# Patient Record
Sex: Female | Born: 1980 | Race: White | Hispanic: No | Marital: Single | Smoking: Current every day smoker
Health system: Southern US, Community
[De-identification: ages and names within clinical notes are randomized; demographics above are authoritative.]

## PROBLEM LIST (undated history)

## (undated) DIAGNOSIS — R87629 Unspecified abnormal cytological findings in specimens from vagina: Secondary | ICD-10-CM

## (undated) DIAGNOSIS — N870 Mild cervical dysplasia: Secondary | ICD-10-CM

## (undated) HISTORY — DX: Unspecified abnormal cytological findings in specimens from vagina: R87.629

## (undated) HISTORY — PX: PLACEMENT OF BREAST IMPLANTS: SHX6334

## (undated) HISTORY — DX: Mild cervical dysplasia: N87.0

---

## 2012-10-19 ENCOUNTER — Ambulatory Visit: Payer: Self-pay | Admitting: General Surgery

## 2012-11-05 ENCOUNTER — Encounter: Payer: Self-pay | Admitting: *Deleted

## 2016-07-01 ENCOUNTER — Encounter: Payer: Self-pay | Admitting: *Deleted

## 2016-07-01 ENCOUNTER — Ambulatory Visit: Payer: Self-pay | Attending: Oncology | Admitting: *Deleted

## 2016-07-01 VITALS — BP 133/87 | HR 76 | Temp 98.3°F | Ht 66.54 in | Wt 140.9 lb

## 2016-07-01 DIAGNOSIS — N63 Unspecified lump in unspecified breast: Secondary | ICD-10-CM

## 2016-07-01 NOTE — Patient Instructions (Signed)
HPV Test The human papillomavirus (HPV) test is used to look for high-risk types of HPV infection. HPV is a group of about 100 viruses. Many of these viruses cause growths on, in, or around the genitals. Most HPV viruses cause infections that usually go away without treatment. However, HPV types 6, 11, 16, and 18 are considered high-risk types of HPV that can increase your risk of cancer of the cervix or anus if the infection is left untreated. An HPV test identifies the DNA (genetic) strands of the HPV infection, so it is also referred to as the HPV DNA test. Although HPV is found in both males and females, the HPV test is only used to screen for increased cancer risk in females:  With an abnormal Pap test.  After treatment of an abnormal Pap test.  Between the ages of 30 and 65.  After treatment of a high-risk HPV infection. The HPV test may be done at the same time as a pelvic exam and Pap test in females over the age of 30. Both the HPV test and Pap test require a sample of cells from the cervix. How do I prepare for this test?  Do not douche or take a bath for 24-48 hours before the test or as directed by your health care provider.  Do not have sex for 24-48 hours before the test or as directed by your health care provider.  You may be asked to reschedule the test if you are menstruating.  You will be asked to urinate before the test. What do the results mean? It is your responsibility to obtain your test results. Ask the lab or department performing the test when and how you will get your results. Talk with your health care provider if you have any questions about your results. Your result will be negative or positive. Meaning of Negative Test Results  A negative HPV test result means that no HPV was found, and it is very likely that you do not have HPV. Meaning of Positive Test Results  A positive HPV test result indicates that you have HPV.  If your test result shows the presence  of any high-risk HPV strains, you may have an increased risk of developing cancer of the cervix or anus if the infection is left untreated.  If any low-risk HPV strains are found, you are not likely to have an increased risk of cancer. Discuss your test results with your health care provider. He or she will use the results to make a diagnosis and determine a treatment plan that is right for you. Talk with your health care provider to discuss your results, treatment options, and if necessary, the need for more tests. Talk with your health care provider if you have any questions about your results. This information is not intended to replace advice given to you by your health care provider. Make sure you discuss any questions you have with your health care provider. Document Released: 05/17/2004 Document Revised: 12/27/2015 Document Reviewed: 09/07/2013 Elsevier Interactive Patient Education  2017 Elsevier Inc.  Gave patient hand-out, Women Staying Healthy, Active and Well from [facility], with education on breast health, pap smears, heart and colon health.  

## 2016-07-01 NOTE — Progress Notes (Signed)
Subjective:     Patient ID: Sheila Sellers, female   DOB: Mar 13, 1981, 36 y.o.   MRN: [id number]  HPI   Review of Systems     Objective:   Physical Exam  Pulmonary/Chest: Right breast exhibits mass. Right breast exhibits no inverted nipple, no nipple discharge, no skin change and no tenderness. Left breast exhibits mass. Left breast exhibits no inverted nipple, no nipple discharge, no skin change and no tenderness. Breasts are symmetrical.         Assessment:     36 year old White female referred to [facility] by Levin ErpAlicia Copeland, NP at [facility] for financial assistance for further evaluation of her abnormal pap smear.  Patient with a history of abnormal paps.  Had:  ASCUS 2014, 10/14/13 Normal, 01/21/15 had ASCUS, and 05/08/16 had normal pap with positive HPV of 16 and 18.  Patient states she has had a left breast mass for over 10 years, but the area of concern has gotten much more tender over the last 4 months since taking a new birth control with progesterone.  Patient does have a family history of breast cancer in a paternal aunt diagnosed in her 830's or 4440's per Copeland's notes.  On clinical breast exam bilateral breast have scattered firm fibroglandular like tissue.  There is an approximate 1 cm nodule at 6:00 right breast, and an approximate 3X2 cm tender mobile nodule at 2:00 left breast. 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  She has a good understanding of the plan of care.  Patient has been screened for eligibility.  She does not have any insurance, Medicare or Medicaid.  She also meets financial eligibility.  Hand-out given on the Affordable Care Act.    Plan:     Bilateral diagnostic mammogram with ultrasound ordered.  Discussed if no findings on imaging the possibility of referral for a surgical consult.  Joellyn Quailshristy Burton to schedule patient her mammogram and appointment back to [facility] for colposcopy and possible biopsy.  Will follow-up per [facility]  protocol.

## 2016-07-11 ENCOUNTER — Ambulatory Visit
Admission: RE | Admit: 2016-07-11 | Discharge: 2016-07-11 | Disposition: A | Discharge status: Home / Self Care | Payer: Self-pay | Source: Ambulatory Visit | Attending: Oncology | Admitting: Oncology

## 2016-07-11 ENCOUNTER — Encounter: Payer: Self-pay | Admitting: Obstetrics and Gynecology

## 2016-07-11 DIAGNOSIS — N63 Unspecified lump in unspecified breast: Secondary | ICD-10-CM

## 2016-07-12 ENCOUNTER — Telehealth: Payer: Self-pay | Admitting: *Deleted

## 2016-07-12 NOTE — Telephone Encounter (Signed)
Called and talked with patient today to discuss her mammogram and ultrasound results.  In the right breast there are simple cysts noted at the site of concern, and dense breast tissue in the left breast at the area of targeted pain and palpable mass.  Offered to send patient for a surgical consult.  States the area in the left breast has been there for over 10 years and right now, the pain is better and tissue is much softer, which she attributes to her menstrual cycle.  Encouraged a second opinion but she is comfortable with her results.  She is to call if she changes her mind or notices a difference in the areas of concern.  She is agreeable.  Radiology recommended annual mammogram at age 89.  HSIS to [facility].

## 2016-07-23 ENCOUNTER — Encounter: Payer: Self-pay | Admitting: Obstetrics & Gynecology

## 2016-07-23 ENCOUNTER — Ambulatory Visit (INDEPENDENT_AMBULATORY_CARE_PROVIDER_SITE_OTHER): Payer: Self-pay | Admitting: Obstetrics & Gynecology

## 2016-07-23 VITALS — BP 100/60 | HR 86 | Ht 66.0 in | Wt 139.0 lb

## 2016-07-23 DIAGNOSIS — B977 Papillomavirus as the cause of diseases classified elsewhere: Secondary | ICD-10-CM

## 2016-07-23 DIAGNOSIS — N72 Inflammatory disease of cervix uteri: Principal | ICD-10-CM

## 2016-07-23 NOTE — Progress Notes (Signed)
  Referring Provider:  [facility] PAP by Copland at [facility]  HPI:  Sheila Sellers is a 36 y.o.  No obstetric history on file.  who presents today for evaluation and management of abnormal cervical cytology.    Dysplasia History: History of intermittant LGSIL, ASCUS, HPV, and Normal PAPS over the last few years. Prior cervical dysplasia and treatment 20 years ago (possible LEEP, [place] unclear, [place]  ROS:  Pertinent items noted in HPI and remainder of comprehensive ROS otherwise negative.  OB History  No data available    No past medical history on file.  No past surgical history on file.  SOCIAL HISTORY:  History  Alcohol Use  . Yes    History  Drug Use No     Family History  Problem Relation Age of Onset  . Breast cancer Maternal Aunt     ALLERGIES:  Patient has no known allergies.  No current outpatient prescriptions on file prior to visit.   No current facility-administered medications on file prior to visit.     Physical Exam: -Vitals:  BP 100/60   Pulse 86   Ht 5\' 6"  (1.676 m)   Wt 139 lb (63 kg)   LMP 07/07/2016   BMI 22.44 kg/m  GEN: WD, WN, NAD.  A+ O x 3, good mood and affect. ABD:  NT, ND.  Soft, no masses.  No hernias noted.   Pelvic:   Vulva: Normal appearance.  No lesions.  Vagina: No lesions or abnormalities noted.  Support: Normal pelvic support.  Urethra No masses tenderness or scarring.  Meatus Normal size without lesions or prolapse.  Cervix: See below.  Anus: Normal exam.  No lesions.  Perineum: Normal exam.  No lesions.        Bimanual   Uterus: Normal size.  Non-tender.  Mobile.  AV.  Adnexae: No masses.  Non-tender to palpation.  Cul-de-sac: Negative for abnormality.   PROCEDURE: 1.  Urine Pregnancy Test:  negative 2.  Colposcopy performed with 4% acetic acid after verbal consent obtained                                         -Aceto-white Lesions Location(s): none.              -Biopsy performed at 11 o'clock           -ECC indicated and performed: Yes.       -Biopsy sites made hemostatic with pressure, AgNO3, and/or Monsel's solution   -Satisfactory colposcopy: Yes.      -Evidence of Invasive cervical CA :  NO  ASSESSMENT:  Sheila Sellers is a 36 y.o. No obstetric history on file. here for  1. High risk human papilloma virus (HPV) infection of cervix   .  PLAN: 1.  I discussed the grading system of pap smears and HPV high risk viral types.  We will discuss and base management after colpo results return. 2. Follow up PAP 6 months, vs intervention if high grade dysplasia identified 3. Treatment of persistantly abnormal PAP smears and cervical dysplasia, even mild, is discussed w pt today in detail, as well as the pros and cons of Cryo and LEEP procedures. Will consider and discuss after results.      Annamarie MajorPaul Arliene Rosenow, MD, Merlinda FrederickFACOG [facility], [facility] 07/23/2016  1:29 PM

## 2016-07-26 LAB — PATHOLOGY

## 2016-07-31 ENCOUNTER — Encounter: Payer: Self-pay | Admitting: Obstetrics and Gynecology

## 2016-07-31 ENCOUNTER — Other Ambulatory Visit: Payer: Self-pay | Admitting: Obstetrics and Gynecology

## 2016-07-31 MED ORDER — NORETHINDRONE 0.35 MG PO TABS: 1.0000 | ORAL_TABLET | Freq: Every day | ORAL | 10 refills | Status: DC

## 2016-09-12 ENCOUNTER — Telehealth: Payer: Self-pay | Admitting: Obstetrics & Gynecology

## 2016-09-12 NOTE — Telephone Encounter (Signed)
Pt is calling about bill stating that she was suppose to be covered under the Cancer center for the Colpo. Would you please look into this . [phone]

## 2016-09-13 NOTE — Telephone Encounter (Signed)
Pt is calling about her bill with Questions. Please advise.

## 2016-12-25 ENCOUNTER — Ambulatory Visit: Payer: Self-pay | Attending: Oncology

## 2018-04-10 ENCOUNTER — Encounter: Payer: Self-pay | Admitting: Family Medicine

## 2018-04-10 ENCOUNTER — Other Ambulatory Visit: Payer: Self-pay | Admitting: Family Medicine

## 2018-04-10 ENCOUNTER — Ambulatory Visit: Payer: Self-pay | Admitting: Family Medicine

## 2018-04-10 VITALS — BP 117/75 | HR 86 | Temp 98.6°F | Resp 16 | Ht 66.0 in | Wt 141.0 lb

## 2018-04-10 DIAGNOSIS — N870 Mild cervical dysplasia: Secondary | ICD-10-CM

## 2018-04-10 DIAGNOSIS — Z720 Tobacco use: Secondary | ICD-10-CM | POA: Diagnosis not present

## 2018-04-10 DIAGNOSIS — Z7689 Persons encountering health services in other specified circumstances: Secondary | ICD-10-CM

## 2018-04-10 DIAGNOSIS — Z13220 Encounter for screening for lipoid disorders: Secondary | ICD-10-CM

## 2018-04-10 DIAGNOSIS — N72 Inflammatory disease of cervix uteri: Secondary | ICD-10-CM | POA: Diagnosis not present

## 2018-04-10 DIAGNOSIS — Z131 Encounter for screening for diabetes mellitus: Secondary | ICD-10-CM

## 2018-04-10 DIAGNOSIS — B977 Papillomavirus as the cause of diseases classified elsewhere: Secondary | ICD-10-CM

## 2018-04-10 DIAGNOSIS — Z114 Encounter for screening for human immunodeficiency virus [HIV]: Secondary | ICD-10-CM

## 2018-04-10 DIAGNOSIS — Z0000 Encounter for general adult medical examination without abnormal findings: Secondary | ICD-10-CM

## 2018-04-10 NOTE — Progress Notes (Signed)
Subjective:    Patient ID: Sheila Sellers, female    DOB: 01-12-1981, 37 y.o.   MRN: [id number]  Sheila Drapemanda Gettinger is a 37 y.o. female presenting on 04/10/2018 for Establish Care and Abnormal Pap Smear  Previously without PCP, she has moved to [place] recently, here to establish.  HPI   General Health / Lifestyle / Tobacco Use - She has no new medical complaints or concerns. 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 Most recently followed by [facility] locally in 07/2016 they performed Colposcopy after abnormal pap and showed Low Grade CIN dysplasia.   Health Maintenance: UTD FLu vaccine from 03/2018 at work Due for routine HIV screen, has not had this lab tested before by her knowledge, agrees to check with next lab. UTD Tdap by report approx 2016  Depression screen PHQ 2/9 04/10/2018  Decreased Interest 0  Down, Depressed, Hopeless 0  PHQ - 2 Score 0    Past Medical History:  Diagnosis Date  . Abnormal vaginal Pap smear    History reviewed. No pertinent surgical history. Social History   Socioeconomic History  . Marital status: Single    Spouse name: Not on file  . Number of children: Not on file  . Years of education: Graduate/Professional  . Highest education level: Professional school degree (e.g., MD, DDS, DVM, JD)  Occupational History  . Occupation: Neurosurgery PA    Comment: [employer]  Social Needs  . Financial resource strain: Not on file  . Food insecurity:    Worry: Not on file    Inability: Not on file  . Transportation  needs:    Medical: Not on file    Non-medical: Not on file  Tobacco Use  . Smoking status: Current Every Day Smoker    Packs/day: 0.50    Years: 20.00    Pack years: 10.00    Types: Cigarettes  . Smokeless tobacco: Never Used  Substance and Sexual Activity  . Alcohol use: Yes    Alcohol/week: 4.0 standard drinks    Types: 4 Standard drinks or equivalent per week  . Drug use: No  . Sexual activity: Yes    Birth control/protection: None  Lifestyle  . Physical activity:    Days per week: Not on file    Minutes per session: Not on file  . Stress: Not on file  Relationships  . Social connections:    Talks on phone: Not on file    Gets together: Not on file    Attends religious service: Not on file    Active member of club or organization: Not on file    Attends meetings of clubs or organizations: Not on file    Relationship status: Not on file  . Intimate partner violence:    Fear of current or ex partner: Not on file    Emotionally abused: Not on file    Physically abused: Not on file    Forced sexual activity: Not on file  Other Topics Concern  .  Not on file  Social History Narrative  . Not on file   Family History  Problem Relation Age of Onset  . Breast cancer Maternal Aunt   . Heart attack Maternal Grandfather 45  . Lung cancer Paternal Grandfather 49   No current outpatient medications on file prior to visit.   No current facility-administered medications on file prior to visit.     Review of Systems Per HPI unless specifically indicated above      Objective:    BP 117/75   Pulse 86   Temp 98.6 F (37 C) (Oral)   Resp 16   Ht 5\' 6"  (1.676 m)   Wt 141 lb (64 kg)   BMI 22.76 kg/m   Wt Readings from Last 3 Encounters:  04/10/18 141 lb (64 kg)  07/23/16 139 lb (63 kg)  07/01/16 140 lb 14 oz (63.9 kg)    Physical Exam  Constitutional: She is oriented to person, place, and time. She appears well-developed and well-nourished. No distress.    Well-appearing, comfortable, cooperative  HENT:  Head: Normocephalic and atraumatic.  Eyes: Conjunctivae are normal. Right eye exhibits no discharge. Left eye exhibits no discharge.  Cardiovascular: Normal rate.  Pulmonary/Chest: Effort normal.  Musculoskeletal: She exhibits no edema.  Neurological: She is alert and oriented to person, place, and time.  Skin: Skin is warm and dry. No rash noted. She is not diaphoretic. No erythema.  Psychiatric: She has a normal mood and affect. Her behavior is normal.  Well groomed, good eye contact, normal speech and thoughts  Nursing note and vitals reviewed.      Assessment & Plan:   Problem List Items Addressed This Visit    Dysplasia of cervix, low grade (CIN 1) - Primary    Chronic problem with abnormal paps and HPV high risk, most recent 07/2016 through [facility] low grade CIN  Plan Agree with prior recommendation from GYN - she is overdue for next pap smear with cytology / hpv - she will check into other local GYN options, handout given w/ info for [facility], she may need to check [employer] / [facility] insurance to check on providers in network and request referral when ready      High risk human papilloma virus (HPV) infection of cervix   Tobacco use    Active smoker Follow-up as scheduled to review smoking cessation efforts       Other Visit Diagnoses    Encounter to establish care with new doctor        Reviewed outside records from GYN from 2018, no other records available and no other regular PCP in past, no additional requested records.    No orders of the defined types were placed in this encounter.   Follow up plan: Return in about 3 months (around 07/10/2018) for Annual Physical.   Follow-up 3 months on 07/03/18 - routine physical labs  Saralyn Pilar, DO [facility] 04/10/2018, 10:39 AM

## 2018-04-10 NOTE — Patient Instructions (Addendum)
Thank you for coming to the office today.  Check with this GYN locally to see if they are covered or in network - if you need a referral let me know.  [facility] [address], [address] [place], [place] [address] Hours: 8am - 5pm Main: [phone]  DUE for FASTING BLOOD WORK (no food or drink after midnight before the lab appointment, only water or coffee without cream/sugar on the morning of)  SCHEDULE "Lab Only" visit in the morning at the clinic for lab draw in 3 MONTHS   - Make sure Lab Only appointment is at about 1 week before your next appointment, so that results will be available  For Lab Results, once available within 2-3 days of blood draw, you can can log in to MyChart online to view your results and a brief explanation. Also, we can discuss results at next follow-up visit.  Please schedule a Follow-up Appointment to: Return in about 3 months (around 07/10/2018) for Annual Physical.  If you have any other questions or concerns, please feel free to call the office or send a message through MyChart. You may also schedule an earlier appointment if necessary.  Additionally, you may be receiving a survey about your experience at our office within a few days to 1 week by e-mail or mail. We value your feedback.  Saralyn PilarAlexander Jasen Hartstein, DO [facility]

## 2018-04-10 NOTE — Assessment & Plan Note (Signed)
Chronic problem with abnormal paps and HPV high risk, most recent 07/2016 through [facility] low grade CIN  Plan Agree with prior recommendation from GYN - she is overdue for next pap smear with cytology / hpv - she will check into other local GYN options, handout given w/ info for [facility], she may need to check [employer] [place] / [facility] insurance to check on providers in network and request referral when ready

## 2018-04-10 NOTE — Assessment & Plan Note (Signed)
Active smoker Follow-up as scheduled to review smoking cessation efforts

## 2018-07-03 ENCOUNTER — Other Ambulatory Visit: Payer: Self-pay | Admitting: Family Medicine

## 2018-07-03 DIAGNOSIS — N870 Mild cervical dysplasia: Secondary | ICD-10-CM

## 2018-07-03 DIAGNOSIS — Z13220 Encounter for screening for lipoid disorders: Secondary | ICD-10-CM

## 2018-07-03 DIAGNOSIS — Z0000 Encounter for general adult medical examination without abnormal findings: Secondary | ICD-10-CM

## 2018-07-03 DIAGNOSIS — Z114 Encounter for screening for human immunodeficiency virus [HIV]: Secondary | ICD-10-CM

## 2018-07-03 DIAGNOSIS — Z131 Encounter for screening for diabetes mellitus: Secondary | ICD-10-CM

## 2018-07-04 LAB — COMPLETE METABOLIC PANEL WITH GFR
AG Ratio: 1.9 (calc) (ref 1.0–2.5)
ALT: 12 U/L (ref 6–29)
AST: 13 U/L (ref 10–30)
Albumin: 4.5 g/dL (ref 3.6–5.1)
Alkaline phosphatase (APISO): 56 U/L (ref 31–125)
BUN: 14 mg/dL (ref 7–25)
CO2: 27 mmol/L (ref 20–32)
Calcium: 9.9 mg/dL (ref 8.6–10.2)
Chloride: 106 mmol/L (ref 98–110)
Creat: 0.85 mg/dL (ref 0.50–1.10)
GFR, EST NON AFRICAN AMERICAN: 87 mL/min/{1.73_m2} (ref >=60)
GFR, Est African American: 101 mL/min/{1.73_m2} (ref >=60)
GLUCOSE: 99 mg/dL (ref 65–99)
Globulin: 2.4 g/dL (calc) (ref 1.9–3.7)
Potassium: 4.4 mmol/L (ref 3.5–5.3)
Sodium: 140 mmol/L (ref 135–146)
Total Bilirubin: 0.4 mg/dL (ref 0.2–1.2)
Total Protein: 6.9 g/dL (ref 6.1–8.1)

## 2018-07-04 LAB — CBC WITH DIFFERENTIAL/PLATELET
Absolute Monocytes: 592 cells/uL (ref 200–950)
BASOS ABS: 41 {cells}/uL (ref 0–200)
Basophils Relative: 0.6 %
Eosinophils Absolute: 150 cells/uL (ref 15–500)
Eosinophils Relative: 2.2 %
HCT: 40.1 % (ref 35.0–45.0)
HEMOGLOBIN: 13.7 g/dL (ref 11.7–15.5)
Lymphs Abs: 1938 cells/uL (ref 850–3900)
MCH: 32.6 pg (ref 27.0–33.0)
MCHC: 34.2 g/dL (ref 32.0–36.0)
MCV: 95.5 fL (ref 80.0–100.0)
MPV: 10.6 fL (ref 7.5–12.5)
Monocytes Relative: 8.7 %
Neutro Abs: 4080 cells/uL (ref 1500–7800)
Neutrophils Relative %: 60 %
Platelets: 245 10*3/uL (ref 140–400)
RBC: 4.2 10*6/uL (ref 3.80–5.10)
RDW: 11.7 % (ref 11.0–15.0)
Total Lymphocyte: 28.5 %
WBC: 6.8 10*3/uL (ref 3.8–10.8)

## 2018-07-04 LAB — HIV ANTIBODY (ROUTINE TESTING W REFLEX): HIV 1&2 Ab, 4th Generation: NONREACTIVE

## 2018-07-04 LAB — HEMOGLOBIN A1C
Hgb A1c MFr Bld: 5 % of total Hgb (ref <5.7)
Mean Plasma Glucose: 97 (calc)
eAG (mmol/L): 5.4 (calc)

## 2018-07-04 LAB — LIPID PANEL
Cholesterol: 162 mg/dL (ref <200)
HDL: 49 mg/dL — ABNORMAL LOW (ref >=50)
LDL Cholesterol (Calc): 99 mg/dL (calc)
NON-HDL CHOLESTEROL (CALC): 113 mg/dL (ref <130)
Total CHOL/HDL Ratio: 3.3 (calc) (ref <5.0)
Triglycerides: 47 mg/dL (ref <150)

## 2018-07-04 LAB — TSH: TSH: 2.11 mIU/L

## 2018-07-10 ENCOUNTER — Encounter: Payer: Self-pay | Admitting: Family Medicine

## 2018-07-10 ENCOUNTER — Other Ambulatory Visit: Payer: Self-pay

## 2018-07-10 ENCOUNTER — Ambulatory Visit (INDEPENDENT_AMBULATORY_CARE_PROVIDER_SITE_OTHER): Payer: Managed Care, Other (non HMO) | Admitting: Family Medicine

## 2018-07-10 VITALS — BP 111/73 | HR 91 | Temp 98.7°F | Resp 16 | Ht 66.0 in | Wt 141.2 lb

## 2018-07-10 DIAGNOSIS — Z0000 Encounter for general adult medical examination without abnormal findings: Secondary | ICD-10-CM

## 2018-07-10 DIAGNOSIS — L74510 Primary focal hyperhidrosis, axilla: Secondary | ICD-10-CM

## 2018-07-10 DIAGNOSIS — Z720 Tobacco use: Secondary | ICD-10-CM | POA: Diagnosis not present

## 2018-07-10 DIAGNOSIS — N870 Mild cervical dysplasia: Secondary | ICD-10-CM | POA: Diagnosis not present

## 2018-07-10 NOTE — Patient Instructions (Addendum)
Thank you for coming to the office today.  Lab results are excellent! Keep up the great work.  If ready to quit smoking in next few months - send a mychart message and we can start the Chantix as discussed today, will do 1 month starter pack and see you back after 3-4 weeks to continue this treatment up to 3 months. If mood is affected significantly or other side effects as reviewed, may need to stop and try alternative medication, contact us if any problems.  Try to locate GYN provider in [place] or [place] and let us know who insurance prefers - we will look into this as well to find some providers for you.  Try to keep track of the Hyperhidrosis and Tremors If any new significant changes let me know - I will look into these as well and reach out to you if I find any more information.  DUE for FASTING BLOOD WORK (no food or drink after midnight before the lab appointment, only water or coffee without cream/sugar on the morning of)  SCHEDULE "Lab Only" visit in the morning at the clinic for lab draw in 1 YEAR  - Make sure Lab Only appointment is at about 1 week before your next appointment, so that results will be available  For Lab Results, once available within 2-3 days of blood draw, you can can log in to MyChart online to view your results and a brief explanation. Also, we can discuss results at next follow-up visit.   Please schedule a Follow-up Appointment to: Return in about 1 year (around 07/10/2019) for Annual Physical.  If you have any other questions or concerns, please feel free to call the office or send a message through MyChart. You may also schedule an earlier appointment if necessary.  Additionally, you may be receiving a survey about your experience at our office within a few days to 1 week by e-mail or mail. We value your feedback.  Saralyn Pilar, DO [facility], [place]

## 2018-07-10 NOTE — Progress Notes (Signed)
Subjective:    Patient ID: Sheila Sellers, female    DOB: 01/24/1981, 38 y.o.   MRN: [id number]  Sheila Sellers is a 38 y.o. female presenting on 07/10/2018 for Annual Exam   HPI   Here for Annual Physical and Lab Review.  General Health / Lifestyle / Tobacco Use - She has reviewed Lab results, no significant concerns. - Balanced diet. Limited water intake. Drinks coffee / caffeine - Limited regular exercise, but does do some home exercises - She is an active smoker, < 1 ppd, 20 years - not sure if ready to quit, in past she tried Wellbutrin in past and it helped but it made her mood down, and quit for up to 3 months, now may consider chantix as an option, not quite ready yet though  Additional concerns:  Hyperhidrosis, axillary Reports new problem over past 2 years, seems to last for few months, persistent - tried OTC / rx strength deodorant, without improvement, not seem to be triggered by stressful situation or temperature, seems to have few months without symptoms  Tremors, bilateral hands Reports episodes of some tremor of hands, seems to be different times, if active, or even at rest. Feels some symptom of "internal" tremor, also has episodes when holding or lifting objects. 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 Most recently followed by [facility] locally in 07/2016 they performed Colposcopy after abnormal pap and showed Low Grade CIN dysplasia. - Now she is interested to go to [facility] or [place] for establish with new GYN   Depression screen [facility] 2/9 07/10/2018 04/10/2018  Decreased Interest 0 0  Down, Depressed, Hopeless 0 0  PHQ - 2 Score 0 0    Past  Medical History:  Diagnosis Date  . Abnormal vaginal Pap smear    History reviewed. No pertinent surgical history. Social History   Socioeconomic History  . Marital status: Single    Spouse name: Not on file  . Number of children: Not on file  . Years of education: Graduate/Professional  . Highest education level: Professional school degree (e.g., MD, DDS, DVM, JD)  Occupational History  . Occupation: Neurosurgery PA    Comment: [employer]  Social Needs  . Financial resource strain: Not on file  . Food insecurity:    Worry: Not on file    Inability: Not on file  . Transportation needs:    Medical: Not on file    Non-medical: Not on file  Tobacco Use  . Smoking status: Current Every Day Smoker    Packs/day: 0.50    Years: 20.00    Pack years: 10.00    Types: Cigarettes  . Smokeless tobacco: Current User  Substance and Sexual Activity  . Alcohol use: Yes    Alcohol/week: 4.0 standard drinks    Types: 4 Standard drinks or equivalent per week  . Drug use: No  . Sexual activity: Yes    Birth control/protection: None  Lifestyle  . Physical activity:    Days per week: Not on file    Minutes per session: Not on file  . Stress: Not on file  Relationships  . Social connections:  Talks on phone: Not on file    Gets together: Not on file    Attends religious service: Not on file    Active member of club or organization: Not on file    Attends meetings of clubs or organizations: Not on file    Relationship status: Not on file  . Intimate partner violence:    Fear of current or ex partner: Not on file    Emotionally abused: Not on file    Physically abused: Not on file    Forced sexual activity: Not on file  Other Topics Concern  . Not on file  Social History Narrative  . Not on file   Family History  Problem Relation Age of Onset  . Breast cancer Maternal Aunt   . Heart attack Maternal Grandfather 45  . Lung cancer Paternal Grandfather 75   No current  outpatient medications on file prior to visit.   No current facility-administered medications on file prior to visit.     Review of Systems  Constitutional: Negative for activity change, appetite change, chills, diaphoresis, fatigue and fever.  HENT: Negative for congestion and hearing loss.   Eyes: Negative for visual disturbance.  Respiratory: Negative for apnea, cough, choking, chest tightness, shortness of breath and wheezing.   Cardiovascular: Negative for chest pain, palpitations and leg swelling.  Gastrointestinal: Negative for abdominal pain, anal bleeding, blood in stool, constipation, diarrhea, nausea and vomiting.  Endocrine: Negative for cold intolerance.  Genitourinary: Negative for difficulty urinating, dysuria, frequency and hematuria.  Musculoskeletal: Negative for arthralgias, back pain and neck pain.  Skin: Negative for rash.  Allergic/Immunologic: Negative for environmental allergies.  Neurological: Positive for tremors. Negative for dizziness, weakness, light-headedness, numbness and headaches.  Hematological: Negative for adenopathy.  Psychiatric/Behavioral: Negative for behavioral problems, dysphoric mood and sleep disturbance. The patient is not nervous/anxious.    Per HPI unless specifically indicated above      Objective:    BP 111/73   Pulse 91   Temp 98.7 F (37.1 C) (Oral)   Resp 16   Ht  (1.676 m)   Wt 141 lb 3.2 oz (64 kg)   BMI 22.79 kg/m   Wt Readings from Last 3 Encounters:  07/10/18 141 lb 3.2 oz (64 kg)  04/10/18 141 lb (64 kg)  07/23/16 139 lb (63 kg)    Physical Exam Vitals signs and nursing note reviewed.  Constitutional:      General: She is not in acute distress.    Appearance: She is well-developed. She is not diaphoretic.     Comments: Well-appearing, comfortable, cooperative  HENT:     Head: Normocephalic and atraumatic.     Comments: Frontal / maxillary sinuses non-tender. Nares patent without congestion  Bilateral TMs  clear without erythema, effusion or bulging. Oropharynx clear without erythema, exudates, edema or asymmetry. Eyes:     General:        Right eye: No discharge.        Left eye: No discharge.     Conjunctiva/sclera: Conjunctivae normal.     Pupils: Pupils are equal, round, and reactive to light.  Neck:     Musculoskeletal: Normal range of motion and neck supple.     Thyroid: No thyromegaly.  Cardiovascular:     Rate and Rhythm: Normal rate and regular rhythm.     Heart sounds: Normal heart sounds. No murmur.  Pulmonary:     Effort: Pulmonary effort is normal. No respiratory distress.     Breath  sounds: Normal breath sounds. No wheezing or rales.  Abdominal:     General: Bowel sounds are normal. There is no distension.     Palpations: Abdomen is soft. There is no mass.     Tenderness: There is no abdominal tenderness.  Musculoskeletal: Normal range of motion.        General: No tenderness.     Comments: Upper / Lower Extremities: - Normal muscle tone, strength bilateral upper extremities 5/5, lower extremities 5/5  Lymphadenopathy:     Cervical: No cervical adenopathy.  Skin:    General: Skin is warm and dry.     Findings: No erythema or rash.  Neurological:     Mental Status: She is alert and oriented to person, place, and time.     Comments: Distal sensation intact to light touch all extremities  Psychiatric:        Behavior: Behavior normal.     Comments: Well groomed, good eye contact, normal speech and thoughts    Results for orders placed or performed in visit on 07/03/18  HIV Antibody (routine testing w rflx)  Result Value Ref Range   HIV 1&2 Ab, 4th Generation NON-REACTIVE NON-REACTI  TSH  Result Value Ref Range   TSH 2.11 mIU/L  Lipid panel  Result Value Ref Range   Cholesterol 162 <200 mg/dL   HDL 49 (L) > OR = 50 mg/dL   Triglycerides 47 <615 mg/dL   LDL Cholesterol (Calc) 99 mg/dL (calc)   Total CHOL/HDL Ratio 3.3 <5.0 (calc)   Non-HDL Cholesterol (Calc)  113 <130 mg/dL (calc)  COMPLETE METABOLIC PANEL WITH GFR  Result Value Ref Range   Glucose, Bld 99 65 - 99 mg/dL   BUN 14 7 - 25 mg/dL   Creat 3.79 4.32 - 7.61 mg/dL   GFR, Est Non African American 87 > OR = 60 mL/min/1.81m2   GFR, Est African American 101 > OR = 60 mL/min/1.34m2   BUN/Creatinine Ratio NOT APPLICABLE 6 - 22 (calc)   Sodium 140 135 - 146 mmol/L   Potassium 4.4 3.5 - 5.3 mmol/L   Chloride 106 98 - 110 mmol/L   CO2 27 20 - 32 mmol/L   Calcium 9.9 8.6 - 10.2 mg/dL   Total Protein 6.9 6.1 - 8.1 g/dL   Albumin 4.5 3.6 - 5.1 g/dL   Globulin 2.4 1.9 - 3.7 g/dL (calc)   AG Ratio 1.9 1.0 - 2.5 (calc)   Total Bilirubin 0.4 0.2 - 1.2 mg/dL   Alkaline phosphatase (APISO) 56 31 - 125 U/L   AST 13 10 - 30 U/L   ALT 12 6 - 29 U/L  CBC with Differential/Platelet  Result Value Ref Range   WBC 6.8 3.8 - 10.8 Thousand/uL   RBC 4.20 3.80 - 5.10 Million/uL   Hemoglobin 13.7 11.7 - 15.5 g/dL   HCT 47.0 92.9 - 57.4 %   MCV 95.5 80.0 - 100.0 fL   MCH 32.6 27.0 - 33.0 pg   MCHC 34.2 32.0 - 36.0 g/dL   RDW 73.4 03.7 - 09.6 %   Platelets 245 140 - 400 Thousand/uL   MPV 10.6 7.5 - 12.5 fL   Neutro Abs 4,080 1,500 - 7,800 cells/uL   Lymphs Abs 1,938 850 - 3,900 cells/uL   Absolute Monocytes 592 200 - 950 cells/uL   Eosinophils Absolute 150 15 - 500 cells/uL   Basophils Absolute 41 0 - 200 cells/uL   Neutrophils Relative % 60 %   Total Lymphocyte 28.5 %  Monocytes Relative 8.7 %   Eosinophils Relative 2.2 %   Basophils Relative 0.6 %  Hemoglobin A1c  Result Value Ref Range   Hgb A1c MFr Bld 5.0 <5.7 % of total Hgb   Mean Plasma Glucose 97 (calc)   eAG (mmol/L) 5.4 (calc)      Assessment & Plan:   Problem List Items Addressed This Visit    Dysplasia of cervix, low grade (CIN 1)    Recommend establish with GYN      Hyperhidrosis of axilla    Uncertain exact etiology, 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 Consider possible hormonal component but limited other  symptoms, not generalized with hot flashes, seems localized only, not traditional triggers as well  Plan Monitoring for now, may consider Derm if indicated for other options rx topical antiperspirant      Tobacco use    Active smoker Not ready to quit Prior quit 3 months on wellbutrin, had side effect Interested in Chantix, will reconsider within next several months when ready       Other Visit Diagnoses    Annual physical exam    -  Primary      Updated Health Maintenance information Reviewed recent lab results with patient Encouraged improvement to lifestyle with diet and exercise   No orders of the defined types were placed in this encounter.   Follow up plan: Return in about 1 year (around 07/10/2019) for Annual Physical.  Future labs ordered for 07/09/19  Saralyn Pilar, DO [facility] 07/10/2018, 9:04 AM

## 2018-07-11 ENCOUNTER — Other Ambulatory Visit: Payer: Self-pay | Admitting: Family Medicine

## 2018-07-11 DIAGNOSIS — Z0000 Encounter for general adult medical examination without abnormal findings: Secondary | ICD-10-CM

## 2018-07-11 DIAGNOSIS — L74510 Primary focal hyperhidrosis, axilla: Secondary | ICD-10-CM | POA: Insufficient documentation

## 2018-07-11 NOTE — Assessment & Plan Note (Signed)
Recommend establish with GYN

## 2018-07-11 NOTE — Assessment & Plan Note (Signed)
Active smoker Not ready to quit Prior quit 3 months on wellbutrin, had side effect Interested in Chantix, will reconsider within next several months when ready

## 2018-07-11 NOTE — Assessment & Plan Note (Signed)
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

## 2019-07-09 ENCOUNTER — Other Ambulatory Visit: Payer: Managed Care, Other (non HMO)

## 2019-07-09 DIAGNOSIS — Z0000 Encounter for general adult medical examination without abnormal findings: Secondary | ICD-10-CM

## 2019-07-10 LAB — CBC WITH DIFFERENTIAL/PLATELET
Absolute Monocytes: 681 cells/uL (ref 200–950)
Basophils Absolute: 46 cells/uL (ref 0–200)
Basophils Relative: 0.5 %
Eosinophils Absolute: 101 cells/uL (ref 15–500)
Eosinophils Relative: 1.1 %
HCT: 39.2 % (ref 35.0–45.0)
Hemoglobin: 13.8 g/dL (ref 11.7–15.5)
Lymphs Abs: 2015 cells/uL (ref 850–3900)
MCH: 33.9 pg — ABNORMAL HIGH (ref 27.0–33.0)
MCHC: 35.2 g/dL (ref 32.0–36.0)
MCV: 96.3 fL (ref 80.0–100.0)
MPV: 10.7 fL (ref 7.5–12.5)
Monocytes Relative: 7.4 %
Neutro Abs: 6357 cells/uL (ref 1500–7800)
Neutrophils Relative %: 69.1 %
Platelets: 199 10*3/uL (ref 140–400)
RBC: 4.07 10*6/uL (ref 3.80–5.10)
RDW: 11.8 % (ref 11.0–15.0)
Total Lymphocyte: 21.9 %
WBC: 9.2 10*3/uL (ref 3.8–10.8)

## 2019-07-10 LAB — LIPID PANEL
Cholesterol: 162 mg/dL (ref <200)
HDL: 51 mg/dL (ref >=50)
LDL Cholesterol (Calc): 100 mg/dL (calc) — ABNORMAL HIGH
Non-HDL Cholesterol (Calc): 111 mg/dL (calc) (ref <130)
Total CHOL/HDL Ratio: 3.2 (calc) (ref <5.0)
Triglycerides: 39 mg/dL (ref <150)

## 2019-07-10 LAB — COMPLETE METABOLIC PANEL WITH GFR
AG Ratio: 1.8 (calc) (ref 1.0–2.5)
ALT: 15 U/L (ref 6–29)
AST: 16 U/L (ref 10–30)
Albumin: 4.3 g/dL (ref 3.6–5.1)
Alkaline phosphatase (APISO): 59 U/L (ref 31–125)
BUN: 21 mg/dL (ref 7–25)
CO2: 26 mmol/L (ref 20–32)
Calcium: 9.7 mg/dL (ref 8.6–10.2)
Chloride: 106 mmol/L (ref 98–110)
Creat: 0.76 mg/dL (ref 0.50–1.10)
GFR, Est African American: 115 mL/min/{1.73_m2} (ref >=60)
GFR, Est Non African American: 99 mL/min/{1.73_m2} (ref >=60)
Globulin: 2.4 g/dL (calc) (ref 1.9–3.7)
Glucose, Bld: 88 mg/dL (ref 65–139)
Potassium: 4 mmol/L (ref 3.5–5.3)
Sodium: 139 mmol/L (ref 135–146)
Total Bilirubin: 0.5 mg/dL (ref 0.2–1.2)
Total Protein: 6.7 g/dL (ref 6.1–8.1)

## 2019-07-10 LAB — TSH: TSH: 2.15 mIU/L

## 2019-07-10 LAB — HEMOGLOBIN A1C
Hgb A1c MFr Bld: 5 % of total Hgb (ref <5.7)
Mean Plasma Glucose: 97 (calc)
eAG (mmol/L): 5.4 (calc)

## 2019-07-16 ENCOUNTER — Encounter: Payer: Self-pay | Admitting: Family Medicine

## 2019-07-16 ENCOUNTER — Other Ambulatory Visit: Payer: Self-pay

## 2019-07-16 ENCOUNTER — Ambulatory Visit (INDEPENDENT_AMBULATORY_CARE_PROVIDER_SITE_OTHER): Payer: Managed Care, Other (non HMO) | Admitting: Family Medicine

## 2019-07-16 VITALS — BP 115/66 | HR 78 | Temp 97.7°F | Resp 16 | Ht 66.0 in | Wt 147.0 lb

## 2019-07-16 DIAGNOSIS — Z0000 Encounter for general adult medical examination without abnormal findings: Secondary | ICD-10-CM | POA: Diagnosis not present

## 2019-07-16 DIAGNOSIS — N72 Inflammatory disease of cervix uteri: Secondary | ICD-10-CM

## 2019-07-16 DIAGNOSIS — N870 Mild cervical dysplasia: Secondary | ICD-10-CM

## 2019-07-16 DIAGNOSIS — Z720 Tobacco use: Secondary | ICD-10-CM

## 2019-07-16 DIAGNOSIS — B977 Papillomavirus as the cause of diseases classified elsewhere: Secondary | ICD-10-CM

## 2019-07-16 NOTE — Progress Notes (Signed)
Subjective:    Patient ID: Sheila Sellers, female    DOB: [birth year], 39 y.o.   MRN: [id number]  Sheila Sellers is a 39 y.o. female presenting on 07/16/2019 for Annual Exam   HPI   Here for Annual Physical and Lab Review.  General Health / Lifestyle / Tobacco Use - She has reviewed Lab results, no significant concerns. - Balanced diet but goals to improve. - Limited regular exercise - has some home gym exercises she works on. - She is an active smoker, < 1 ppd, 20 years - not sure if ready to quit, in past she tried Wellbutrin in past and it helped but it made her mood down, and quit for up to 3 months, now may consider chantix as an option, not quite ready yet though  Additional concerns:  PMH - Hyperhidrosis, axillary. Stable, no new changes. Has not seen Dermatology. Trial of rx strength deodorant without improvement.   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 Most recently followed by [facility] locally in 07/2016 they performed Colposcopy after abnormal pap and showed Low Grade CIN dysplasia. 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  UTD Flu Vaccine 01/2019  Depression screen [facility] 2/9 07/16/2019 07/10/2018 04/10/2018  Decreased Interest 0 0 0  Down, Depressed, Hopeless 0 0 0  PHQ - 2 Score 0 0 0    Past Medical History:  Diagnosis Date  . Abnormal vaginal Pap smear    History reviewed. No pertinent surgical history. Social History   Socioeconomic History  . Marital status: Single    Spouse name: Not on file  . Number of children: Not on file  . Years of education:  Graduate/Professional  . Highest education level: Professional school degree (e.g., MD, DDS, DVM, JD)  Occupational History  . Occupation: Neurosurgery PA    Comment: [employer]  Tobacco Use  . Smoking status: Current Every Day Smoker    Packs/day: 0.50    Years: 20.00    Pack years: 10.00    Types: Cigarettes  . Smokeless tobacco: Current User  Substance and Sexual Activity  . Alcohol use: Yes    Alcohol/week: 4.0 standard drinks    Types: 4 Standard drinks or equivalent per week  . Drug use: No  . Sexual activity: Yes    Birth control/protection: None  Other Topics Concern  . Not on file  Social History Narrative  . Not on file   Social Determinants of Health   Financial Resource Strain:   . Difficulty of Paying Living Expenses:   Food Insecurity:   . Worried About Programme researcher, broadcasting/film/video in the Last Year:   . Barista in the Last Year:   Transportation Needs:   . Freight forwarder (Medical):   Marland Kitchen Lack of Transportation (Non-Medical):   Physical Activity:   . Days of Exercise per Week:   . Minutes of Exercise per Session:   Stress:   . Feeling of Stress :   Social Connections:   . Frequency of Communication with Friends and Family:   .  Frequency of Social Gatherings with Friends and Family:   . Attends Religious Services:   . Active Member of Clubs or Organizations:   . Attends Archivist Meetings:   Marland Kitchen Marital Status:   Intimate Partner Violence:   . Fear of Current or Ex-Partner:   . Emotionally Abused:   Marland Kitchen Physically Abused:   . Sexually Abused:    Family History  Problem Relation Age of Onset  . Breast cancer Maternal Aunt   . Heart attack Maternal Grandfather 45  . Lung cancer Paternal Grandfather 31   No current outpatient medications on file prior to visit.   No current facility-administered medications on file prior to visit.    Review of Systems  Constitutional: Negative for activity change, appetite change, chills,  diaphoresis, fatigue and fever.  HENT: Negative for congestion and hearing loss.   Eyes: Negative for visual disturbance.  Respiratory: Negative for apnea, cough, choking, chest tightness, shortness of breath and wheezing.   Cardiovascular: Negative for chest pain, palpitations and leg swelling.  Gastrointestinal: Negative for abdominal pain, anal bleeding, blood in stool, constipation, diarrhea, nausea and vomiting.  Endocrine: Negative for cold intolerance.  Genitourinary: Negative for difficulty urinating, dysuria, frequency and hematuria.  Musculoskeletal: Negative for arthralgias, back pain and neck pain.  Skin: Negative for rash.  Allergic/Immunologic: Negative for environmental allergies.  Neurological: Negative for dizziness, weakness, light-headedness, numbness and headaches.  Hematological: Negative for adenopathy.  Psychiatric/Behavioral: Negative for behavioral problems, dysphoric mood and sleep disturbance. The patient is not nervous/anxious.    Per HPI unless specifically indicated above      Objective:    BP 115/66   Pulse 78   Temp 97.7 F (36.5 C) (Temporal)   Resp 16   Ht 5\' 6"  (1.676 m)   Wt 147 lb (66.7 kg)   BMI 23.73 kg/m   Wt Readings from Last 3 Encounters:  07/16/19 147 lb (66.7 kg)  07/10/18 141 lb 3.2 oz (64 kg)  04/10/18 141 lb (64 kg)    Physical Exam Vitals and nursing note reviewed.  Constitutional:      General: She is not in acute distress.    Appearance: She is well-developed. She is not diaphoretic.     Comments: Well-appearing, comfortable, cooperative  HENT:     Head: Normocephalic and atraumatic.  Eyes:     General:        Right eye: No discharge.        Left eye: No discharge.     Conjunctiva/sclera: Conjunctivae normal.     Pupils: Pupils are equal, round, and reactive to light.  Neck:     Thyroid: No thyromegaly.  Cardiovascular:     Rate and Rhythm: Normal rate and regular rhythm.     Heart sounds: Normal heart sounds. No  murmur.  Pulmonary:     Effort: Pulmonary effort is normal. No respiratory distress.     Breath sounds: Normal breath sounds. No wheezing or rales.  Abdominal:     General: Bowel sounds are normal. There is no distension.     Palpations: Abdomen is soft. There is no mass.     Tenderness: There is no abdominal tenderness.  Musculoskeletal:        General: No tenderness. Normal range of motion.     Cervical back: Normal range of motion and neck supple.     Comments: Upper / Lower Extremities: - Normal muscle tone, strength bilateral upper extremities 5/5, lower extremities 5/5  Lymphadenopathy:  Cervical: No cervical adenopathy.  Skin:    General: Skin is warm and dry.     Findings: No erythema or rash.  Neurological:     Mental Status: She is alert and oriented to person, place, and time.     Comments: Distal sensation intact to light touch all extremities  Psychiatric:        Behavior: Behavior normal.     Comments: Well groomed, good eye contact, normal speech and thoughts    Results for orders placed or performed in visit on 07/09/19  TSH  Result Value Ref Range   TSH 2.15 mIU/L  Lipid panel  Result Value Ref Range   Cholesterol 162 <200 mg/dL   HDL 51 > OR = 50 mg/dL   Triglycerides 39 <528 mg/dL   LDL Cholesterol (Calc) 100 (H) mg/dL (calc)   Total CHOL/HDL Ratio 3.2 <5.0 (calc)   Non-HDL Cholesterol (Calc) 111 <130 mg/dL (calc)  COMPLETE METABOLIC PANEL WITH GFR  Result Value Ref Range   Glucose, Bld 88 65 - 139 mg/dL   BUN 21 7 - 25 mg/dL   Creat 4.13 2.44 - 0.10 mg/dL   GFR, Est Non African American 99 > OR = 60 mL/min/1.69m2   GFR, Est African American 115 > OR = 60 mL/min/1.21m2   BUN/Creatinine Ratio NOT APPLICABLE 6 - 22 (calc)   Sodium 139 135 - 146 mmol/L   Potassium 4.0 3.5 - 5.3 mmol/L   Chloride 106 98 - 110 mmol/L   CO2 26 20 - 32 mmol/L   Calcium 9.7 8.6 - 10.2 mg/dL   Total Protein 6.7 6.1 - 8.1 g/dL   Albumin 4.3 3.6 - 5.1 g/dL   Globulin  2.4 1.9 - 3.7 g/dL (calc)   AG Ratio 1.8 1.0 - 2.5 (calc)   Total Bilirubin 0.5 0.2 - 1.2 mg/dL   Alkaline phosphatase (APISO) 59 31 - 125 U/L   AST 16 10 - 30 U/L   ALT 15 6 - 29 U/L  CBC with Differential/Platelet  Result Value Ref Range   WBC 9.2 3.8 - 10.8 Thousand/uL   RBC 4.07 3.80 - 5.10 Million/uL   Hemoglobin 13.8 11.7 - 15.5 g/dL   HCT 27.2 53.6 - 64.4 %   MCV 96.3 80.0 - 100.0 fL   MCH 33.9 (H) 27.0 - 33.0 pg   MCHC 35.2 32.0 - 36.0 g/dL   RDW 03.4 74.2 - 59.5 %   Platelets 199 140 - 400 Thousand/uL   MPV 10.7 7.5 - 12.5 fL   Neutro Abs 6,357 1,500 - 7,800 cells/uL   Lymphs Abs 2,015 850 - 3,900 cells/uL   Absolute Monocytes 681 200 - 950 cells/uL   Eosinophils Absolute 101 15 - 500 cells/uL   Basophils Absolute 46 0 - 200 cells/uL   Neutrophils Relative % 69.1 %   Total Lymphocyte 21.9 %   Monocytes Relative 7.4 %   Eosinophils Relative 1.1 %   Basophils Relative 0.5 %  Hemoglobin A1c  Result Value Ref Range   Hgb A1c MFr Bld 5.0 <5.7 % of total Hgb   Mean Plasma Glucose 97 (calc)   eAG (mmol/L) 5.4 (calc)      Assessment & Plan:   Problem List Items Addressed This Visit    Tobacco use   High risk human papilloma virus (HPV) infection of cervix   Dysplasia of cervix, low grade (CIN 1)    Other Visit Diagnoses    Annual physical exam    -  Primary      Updated Health Maintenance information Reviewed recent lab results with patient Encouraged improvement to lifestyle with diet and exercise - Goal of weight loss  #Tobacco Active smoker Counseling reviewed. Not ready to quit Reconsider in future, possible Chantix  #GYN Recommend to promptly consider self referral or request a new referral to GYN as requested when she changes job/insurance in future. Advised should get this evaluation done within 2021  No orders of the defined types were placed in this encounter.   Follow up plan: Return in about 1 year (around 07/15/2020) for Annual Physical.    Future labs in 1 year to be ordered.  Saralyn Pilar, DO [facility] 07/16/2019, 9:21 AM

## 2019-07-16 NOTE — Patient Instructions (Addendum)
Thank you for coming to the office today.  If need referral to GYN or Derm - let me know. For next pap smear / GYN evaluation I strongly recommend within next 1 year to follow-up on prior abnormal result from 2018.  For smoking cessation, we can review this further and discuss treatment options when ready. Otherwise, try to break up your routine and replace the smoking if you can with something else you enjoy that will be something to look forward to during those times   ---------------------   1. Chemistry - Normal results, including electrolytes, kidney and liver function. - Normal fasting blood sugar   2. Hemoglobin A1c (Diabetes screening) - 5.0, normal not in range of Pre-Diabetes (>5.7 to 6.4)   3. TSH Thyroid Function Tests - Normal screening test.  4. CBC Blood Counts - Normal, no anemia, no other significant abnormality  5. Cholesterol - Normal cholesterol. Lab result of LDL 100 flagged in system, but I am not worried about this, 100 or less is excellent.   Please schedule a Follow-up Appointment to: Return in about 1 year (around 07/15/2020) for Annual Physical.  If you have any other questions or concerns, please feel free to call the office or send a message through MyChart. You may also schedule an earlier appointment if necessary.  Additionally, you may be receiving a survey about your experience at our office within a few days to 1 week by e-mail or mail. We value your feedback.  Saralyn Pilar, DO [facility], [place]

## 2020-09-03 HISTORY — PX: AUGMENTATION MAMMAPLASTY: SUR837

## 2021-05-08 ENCOUNTER — Ambulatory Visit (INDEPENDENT_AMBULATORY_CARE_PROVIDER_SITE_OTHER): Payer: No Typology Code available for payment source | Admitting: Obstetrics and Gynecology

## 2021-05-08 ENCOUNTER — Other Ambulatory Visit: Payer: Self-pay

## 2021-05-08 ENCOUNTER — Other Ambulatory Visit (HOSPITAL_COMMUNITY)
Admission: RE | Admit: 2021-05-08 | Discharge: 2021-05-08 | Disposition: A | Discharge status: Home / Self Care | Payer: No Typology Code available for payment source | Source: Ambulatory Visit | Attending: Obstetrics and Gynecology | Admitting: Obstetrics and Gynecology

## 2021-05-08 ENCOUNTER — Encounter: Payer: Self-pay | Admitting: Obstetrics and Gynecology

## 2021-05-08 VITALS — BP 132/80 | HR 83 | Ht 66.0 in | Wt 142.0 lb

## 2021-05-08 DIAGNOSIS — Z01419 Encounter for gynecological examination (general) (routine) without abnormal findings: Secondary | ICD-10-CM | POA: Insufficient documentation

## 2021-05-08 DIAGNOSIS — N6459 Other signs and symptoms in breast: Secondary | ICD-10-CM

## 2021-05-08 DIAGNOSIS — Z113 Encounter for screening for infections with a predominantly sexual mode of transmission: Secondary | ICD-10-CM | POA: Insufficient documentation

## 2021-05-08 DIAGNOSIS — Z9882 Breast implant status: Secondary | ICD-10-CM

## 2021-05-08 DIAGNOSIS — Z01411 Encounter for gynecological examination (general) (routine) with abnormal findings: Secondary | ICD-10-CM | POA: Diagnosis not present

## 2021-05-08 DIAGNOSIS — Z803 Family history of malignant neoplasm of breast: Secondary | ICD-10-CM

## 2021-05-08 DIAGNOSIS — N870 Mild cervical dysplasia: Secondary | ICD-10-CM | POA: Insufficient documentation

## 2021-05-08 NOTE — Progress Notes (Signed)
Obstetrics and Gynecology Annual Patient Evaluation  Appointment Date: 05/08/2021  OBGYN Clinic: [facility]  Primary Care Provider: Smitty Cords  Referring Provider: Saralyn Pilar *  Chief Complaint:  Chief Complaint  Patient presents with   Gynecologic Exam    History of Present Illness: Sheila Sellers is a 41 y.o. G1P0010 (Patient's last menstrual period was 05/04/2021.), seen for the above chief complaint. Her past medical history is significant for cin 1, ?remote h/o LEEP, b/l breast implants  For past few months, patient has felt "pouchniess" on the inner aspect of the left breast; she had breast implants placed last year. No skin changes, overt lumps/bumps, nipple changes or discharge.   Review of Systems: Pertinent items noted in HPI and remainder of comprehensive ROS otherwise negative.    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   Dysplasia of cervix, low grade (CIN 1) 04/10/2018   Tobacco use 04/10/2018   High risk human papilloma virus (HPV) infection of cervix 07/23/2016    Past Medical History:  Past Medical History:  Diagnosis Date   Dysplasia of cervix, low grade (CIN 1)     Past Surgical History:  Past Surgical History:  Procedure Laterality Date   PLACEMENT OF BREAST IMPLANTS      Past Obstetrical History:  OB History  Gravida Para Term Preterm AB Living  1       1    SAB IAB Ectopic Multiple Live Births               # Outcome Date GA Lbr Len/2nd Weight Sex Delivery Anes PTL Lv  1 AB             Past Gynecological History: As per HPI. Periods: qmonth, sometimes ?has two periods in a month History of Pap Smear(s): Yes.   Last pap 05/08/2016 cytology negative but hpv + and 16+. 07/23/2016 colpo (adequate) with cin 1 on bx with neg ecc She is currently using no method for contraception.   Social History:   Social History   Socioeconomic History   Marital status: Single    Spouse name: Not on file   Number of children: Not on file   Years of education: Graduate/Professional   Highest education level: Professional school degree (e.g., MD, DDS, DVM, JD)  Occupational History   Occupation: Neurosurgery PA    Comment: [employer]  Tobacco Use   Smoking status: Every Day    Packs/day: 0.50    Years: 20.00    Pack years: 10.00    Types: Cigarettes   Smokeless tobacco: Current  Vaping Use   Vaping Use: Never used  Substance and Sexual Activity   Alcohol use: Yes    Alcohol/week: 4.0 standard drinks    Types: 4 Standard drinks or equivalent per week    Comment: sicial   Drug use: No   Sexual activity: Yes    Birth control/protection: None  Other Topics Concern   Not on file  Social History Narrative   Not on file   Social Determinants of Health   Financial Resource Strain: Not on file  Food Insecurity: Not on file  Transportation Needs: Not on file  Physical Activity: Not on file  Stress: Not on file  Social Connections: Not on file  Intimate Partner Violence: Not on file    Family History:  Family History  Problem Relation Age of  Onset   Breast cancer Maternal Aunt    Heart attack Maternal Grandfather 45   Lung cancer Paternal Grandfather [id number]    Health Maintenance:  Mammogram(s): none since implants were placed  Medications Ivar Drapemanda Everitt had no medications administered during this visit. No current outpatient medications on file.   No current facility-administered medications for this visit.    Allergies Patient has no known allergies.   Physical Exam:  BP 132/80    Pulse 83    Ht 5\' 6"  (1.676 m)    Wt 142 lb (64.4 kg)    LMP 05/04/2021    BMI 22.92 kg/m  Body mass index is 22.92 kg/m. General appearance: Well nourished, well developed female in no acute distress.  Neck:  Supple, normal appearance, and no thyromegaly  Cardiovascular: normal s1 and s2.   No murmurs, rubs or gallops. Respiratory:  Clear to auscultation bilateral. Normal respiratory effort Abdomen: positive bowel sounds and no masses, hernias; diffusely non tender to palpation, non distended Breasts: bilateral implants, no obvious palpable abnormalities otherwise. Along the left inner breast, there is some slight movement with the edge of the implant but nttp, normal skin.  Neuro/Psych:  Normal mood and affect.  Skin:  Warm and dry.  Lymphatic:  No inguinal lymphadenopathy.   Pelvic exam: is not limited by body habitus EGBUS: within normal limits Vagina: within normal limits and with no blood or discharge in the vault Cervix: normal appearing cervix without tenderness, discharge or lesions. Uterus:  nonenlarged and non tender Adnexa:  normal adnexa and no mass, fullness, tenderness Rectovaginal: deferred  Laboratory: none  Radiology: none  Assessment: pt doing well  Plan:  1. Well woman exam I told her that it can be common for cycles to become shorter with time and as long as between 21-42d then it's still considered normal and if cycles are outside of that to let us know. She had a normal tsh and cbc in 2021  2. Dysplasia of cervix, low grade (CIN 1) - Cytology - PAP( [place])  3. Well woman exam with routine gynecological exam - Cytology - PAP( [place]) - HIV antibody (with reflex) - RPR - Hepatitis B Surface AntiGEN - Hepatitis C Antibody - MM DIAG BREAST TOMO BILATERAL; Future - US BREAST LTD UNI LEFT INC AXILLA; Future  4. Screen for STD (sexually transmitted disease) - Cytology - PAP( [place]) - HIV antibody (with reflex) - RPR - Hepatitis B Surface AntiGEN - Hepatitis C Antibody  5. ?Abnormal breast finding I told her that I recommend imaging to evaluate even though I feel it's probably normal, in order to have a baseline s/p her implants. I told her that even if the imaging is normal that I would touch base with her surgeon to make  sure everything is okay  - MM DIAG BREAST TOMO BILATERAL; Future - US BREAST LTD UNI LEFT INC AXILLA; Future  6. Family history of breast cancer Paternal aunt with hx in her 30s. Pt declines formal GC but I told her if that if she is ever interested to let us know.   7. History of bilateral breast implants  Orders Placed This Encounter  Procedures   MM DIAG BREAST TOMO BILATERAL   US BREAST LTD UNI LEFT INC AXILLA   HIV antibody (with reflex)   RPR   Hepatitis B Surface AntiGEN   Hepatitis C Antibody    RTC PRN  Cornelia Copaharlie Alessander Sikorski, Jr MD Attending [facility] (Faculty  Practice)

## 2021-05-08 NOTE — Progress Notes (Signed)
NGYN patient presents for Annual Exam,  Last Pap:2018 per pt abnormal had colpo.  Last Mammogram: 2018 pt got implants 1 year ago. Pt would like evaluation of left implant.  Family Hx of Breast Cancer:P.Aunt No Hx of cervical/ovarian cancer.  LMP: 05/04/21 periods last 5 days heavy at first then light.  Contraception: None   CC: Vaginal discharge and odor usually around cycle time.

## 2021-05-09 LAB — HEPATITIS C ANTIBODY: Hep C Virus Ab: <0.1 s/co ratio (ref 0.0–0.9)

## 2021-05-09 LAB — RPR: RPR Ser Ql: REACTIVE — AB

## 2021-05-09 LAB — RPR, QUANT+TP ABS (REFLEX)
Rapid Plasma Reagin, Quant: 1:1 {titer} — ABNORMAL HIGH
T Pallidum Abs: NONREACTIVE

## 2021-05-09 LAB — HIV ANTIBODY (ROUTINE TESTING W REFLEX): HIV Screen 4th Generation wRfx: NONREACTIVE

## 2021-05-09 LAB — HEPATITIS B SURFACE ANTIGEN: Hepatitis B Surface Ag: NEGATIVE

## 2021-05-10 ENCOUNTER — Other Ambulatory Visit: Payer: Self-pay | Admitting: Obstetrics and Gynecology

## 2021-05-10 DIAGNOSIS — Z1231 Encounter for screening mammogram for malignant neoplasm of breast: Secondary | ICD-10-CM

## 2021-05-10 LAB — CYTOLOGY - PAP
Chlamydia: NEGATIVE
Comment: NEGATIVE
Comment: NEGATIVE
Comment: NEGATIVE
Comment: NORMAL
Diagnosis: UNDETERMINED — AB
High risk HPV: NEGATIVE
Neisseria Gonorrhea: NEGATIVE
Trichomonas: NEGATIVE

## 2021-05-15 ENCOUNTER — Encounter: Payer: Self-pay | Admitting: Obstetrics and Gynecology

## 2021-05-15 DIAGNOSIS — R768 Other specified abnormal immunological findings in serum: Secondary | ICD-10-CM | POA: Insufficient documentation

## 2021-05-17 ENCOUNTER — Encounter: Payer: Self-pay | Admitting: Obstetrics and Gynecology

## 2021-06-11 ENCOUNTER — Other Ambulatory Visit: Payer: Self-pay

## 2021-06-11 ENCOUNTER — Ambulatory Visit
Admission: RE | Admit: 2021-06-11 | Discharge: 2021-06-11 | Disposition: A | Discharge status: Home / Self Care | Payer: No Typology Code available for payment source | Source: Ambulatory Visit | Attending: Obstetrics and Gynecology | Admitting: Obstetrics and Gynecology

## 2021-06-11 DIAGNOSIS — Z1231 Encounter for screening mammogram for malignant neoplasm of breast: Secondary | ICD-10-CM | POA: Insufficient documentation

## 2022-12-27 ENCOUNTER — Telehealth: Payer: Self-pay

## 2022-12-27 DIAGNOSIS — E7800 Pure hypercholesterolemia, unspecified: Secondary | ICD-10-CM

## 2022-12-27 DIAGNOSIS — Z0000 Encounter for general adult medical examination without abnormal findings: Secondary | ICD-10-CM

## 2022-12-27 DIAGNOSIS — Z131 Encounter for screening for diabetes mellitus: Secondary | ICD-10-CM

## 2022-12-27 NOTE — Telephone Encounter (Signed)
Copied from CRM [id number]. Topic: Appointment Scheduling - Scheduling Inquiry for Clinic >> Dec 27, 2022  9:44 AM Phill Myron wrote: Patient is requesting lab work, she is scheduled for a physical November 6 .    Should she come before November 6 or will they be done at the appt? Please advise patient

## 2022-12-27 NOTE — Telephone Encounter (Signed)
Will route to Rachell to contact patient and schedule AM fasting lab only apt within 1 week prior to 11/6 Physical.  Lab orders are in Epic as future orders now.  Saralyn Pilar, DO [facility] 12/27/2022, 11:17 AM

## 2023-03-05 ENCOUNTER — Other Ambulatory Visit: Payer: No Typology Code available for payment source

## 2023-03-05 DIAGNOSIS — E7800 Pure hypercholesterolemia, unspecified: Secondary | ICD-10-CM

## 2023-03-05 DIAGNOSIS — Z131 Encounter for screening for diabetes mellitus: Secondary | ICD-10-CM

## 2023-03-05 DIAGNOSIS — Z0000 Encounter for general adult medical examination without abnormal findings: Secondary | ICD-10-CM

## 2023-03-06 LAB — CBC WITH DIFFERENTIAL/PLATELET
Absolute Lymphocytes: 1924 {cells}/uL (ref 850–3900)
Absolute Monocytes: 518 {cells}/uL (ref 200–950)
Basophils Absolute: 67 {cells}/uL (ref 0–200)
Basophils Relative: 0.9 %
Eosinophils Absolute: 141 {cells}/uL (ref 15–500)
Eosinophils Relative: 1.9 %
HCT: 42.6 % (ref 35.0–45.0)
Hemoglobin: 14 g/dL (ref 11.7–15.5)
MCH: 33.4 pg — ABNORMAL HIGH (ref 27.0–33.0)
MCHC: 32.9 g/dL (ref 32.0–36.0)
MCV: 101.7 fL — ABNORMAL HIGH (ref 80.0–100.0)
MPV: 10.3 fL (ref 7.5–12.5)
Monocytes Relative: 7 %
Neutro Abs: 4751 {cells}/uL (ref 1500–7800)
Neutrophils Relative %: 64.2 %
Platelets: 152 10*3/uL (ref 140–400)
RBC: 4.19 10*6/uL (ref 3.80–5.10)
RDW: 11.5 % (ref 11.0–15.0)
Total Lymphocyte: 26 %
WBC: 7.4 10*3/uL (ref 3.8–10.8)

## 2023-03-06 LAB — LIPID PANEL
Cholesterol: 197 mg/dL (ref <200)
HDL: 61 mg/dL (ref >=50)
LDL Cholesterol (Calc): 120 mg/dL — ABNORMAL HIGH
Non-HDL Cholesterol (Calc): 136 mg/dL — ABNORMAL HIGH (ref <130)
Total CHOL/HDL Ratio: 3.2 (calc) (ref <5.0)
Triglycerides: 65 mg/dL (ref <150)

## 2023-03-06 LAB — HEMOGLOBIN A1C
Hgb A1c MFr Bld: 5.3 %{Hb} (ref <5.7)
Mean Plasma Glucose: 105 mg/dL
eAG (mmol/L): 5.8 mmol/L

## 2023-03-06 LAB — COMPLETE METABOLIC PANEL WITH GFR
AG Ratio: 1.8 (calc) (ref 1.0–2.5)
ALT: 13 U/L (ref 6–29)
AST: 13 U/L (ref 10–30)
Albumin: 4.3 g/dL (ref 3.6–5.1)
Alkaline phosphatase (APISO): 53 U/L (ref 31–125)
BUN: 16 mg/dL (ref 7–25)
CO2: 25 mmol/L (ref 20–32)
Calcium: 9.6 mg/dL (ref 8.6–10.2)
Chloride: 103 mmol/L (ref 98–110)
Creat: 0.68 mg/dL (ref 0.50–0.99)
Globulin: 2.4 g/dL (ref 1.9–3.7)
Glucose, Bld: 96 mg/dL (ref 65–99)
Potassium: 4.1 mmol/L (ref 3.5–5.3)
Sodium: 137 mmol/L (ref 135–146)
Total Bilirubin: 0.5 mg/dL (ref 0.2–1.2)
Total Protein: 6.7 g/dL (ref 6.1–8.1)
eGFR: 111 mL/min/{1.73_m2} (ref >=60)

## 2023-03-06 LAB — TSH: TSH: 2.37 m[IU]/L

## 2023-03-12 ENCOUNTER — Ambulatory Visit (INDEPENDENT_AMBULATORY_CARE_PROVIDER_SITE_OTHER): Payer: No Typology Code available for payment source | Admitting: Family Medicine

## 2023-03-12 ENCOUNTER — Encounter: Payer: Self-pay | Admitting: Family Medicine

## 2023-03-12 VITALS — BP 118/72 | HR 75 | Ht 65.0 in | Wt 132.0 lb

## 2023-03-12 DIAGNOSIS — Z720 Tobacco use: Secondary | ICD-10-CM

## 2023-03-12 DIAGNOSIS — Z0000 Encounter for general adult medical examination without abnormal findings: Secondary | ICD-10-CM | POA: Diagnosis not present

## 2023-03-12 DIAGNOSIS — E7800 Pure hypercholesterolemia, unspecified: Secondary | ICD-10-CM | POA: Diagnosis not present

## 2023-03-12 NOTE — Patient Instructions (Addendum)
Thank you for coming to the office today.   Keep up the great work! Continue on the NRT patches.  Consider 1 800-QUIT NOW  DUE for FASTING BLOOD WORK (no food or drink after midnight before the lab appointment, only water or coffee without cream/sugar on the morning of)  SCHEDULE "Lab Only" visit in the morning at the clinic for lab draw in 1 YEAR  - Make sure Lab Only appointment is at about 1 week before your next appointment, so that results will be available  For Lab Results, once available within 2-3 days of blood draw, you can can log in to MyChart online to view your results and a brief explanation. Also, we can discuss results at next follow-up visit.   Please schedule a Follow-up Appointment to: Return in about 1 year (around 03/11/2024) for 1 year Annual Physical.  If you have any other questions or concerns, please feel free to call the office or send a message through MyChart. You may also schedule an earlier appointment if necessary.  Additionally, you may be receiving a survey about your experience at our office within a few days to 1 week by e-mail or mail. We value your feedback.  Saralyn Pilar, DO [facility] [place]

## 2023-03-12 NOTE — Progress Notes (Signed)
Subjective:    Patient ID: Sheila Sellers, female    DOB: 05/24/1980, 42 y.o.   MRN: [id number]  Sheila Sellers is a 42 y.o. female presenting on 03/12/2023 for Annual Exam   HPI  Discussed the use of AI scribe software for clinical note transcription with the patient, who gave verbal consent to proceed.   She reports no new health concerns and is not currently on any prescription medications  HYPERLIPIDEMIA: Last lipid panel 03/2023, LDL elevated to 120, prior range 99-100 Not on medication She is on Keto Diet, which can explain this elevation The patient has a family history of high cholesterol but not heart disease.  Tobacco use The patient has a history of smoking, but has significantly reduced her intake, currently using nicotine replacement patches and smoking less than half a pack of cigarettes on some days. She reports no issues with alcohol consumption. - She is using NRT patches 14mg  dosage now. Still smoking occasionally. Trying to quit. - Smoking history < 1ppd for 20+ years. Prior tried Wellbutrin.      Health Maintenance:   UTD Flu vaccine.  Last Pap 05/08/21. ASCUS followed by GYN      03/12/2023    1:17 PM 07/16/2019    9:03 AM 07/10/2018    9:00 AM  Depression screen PHQ 2/9  Decreased Interest 0 0 0  Down, Depressed, Hopeless 0 0 0  PHQ - 2 Score 0 0 0  Altered sleeping 0    Tired, decreased energy 0    Change in appetite 0    Feeling bad or failure about yourself  0    Trouble concentrating 0    Moving slowly or fidgety/restless 0    Suicidal thoughts 0    PHQ-9 Score 0    Difficult doing work/chores Not difficult at all      Past Medical History:  Diagnosis Date   Dysplasia of cervix, low grade (CIN 1)    Past Surgical History:  Procedure Laterality Date   AUGMENTATION MAMMAPLASTY Bilateral 09/2020   silicone   PLACEMENT OF BREAST IMPLANTS     Social History   Socioeconomic History   Marital status: Single    Spouse name: Not on file    Number of children: Not on file   Years of education: Graduate/Professional   Highest education level: Professional school degree (e.g., MD, DDS, DVM, JD)  Occupational History   Occupation: Neurosurgery PA    Comment: [employer]  Tobacco Use   Smoking status: Every Day    Current packs/day: 0.50    Average packs/day: 0.5 packs/day for 20.0 years (10.0 ttl pk-yrs)    Types: Cigarettes   Smokeless tobacco: Current  Vaping Use   Vaping status: Never Used  Substance and Sexual Activity   Alcohol use: Yes    Alcohol/week: 1.0 standard drink of alcohol    Types: 1 Standard drinks or equivalent per week    Comment: 2-3 every 2+ weeks   Drug use: No   Sexual activity: Yes    Birth control/protection: None  Other Topics Concern   Not on file  Social History Narrative   Not on file   Social Determinants of Health   Financial Resource Strain: Not on file  Food Insecurity: Not on file  Transportation Needs: Not on file  Physical Activity: Not on file  Stress: Not on file  Social Connections: Not on file  Intimate Partner Violence: Not on file   Family History  Problem Relation  Age of Onset   Breast cancer Paternal Aunt    Heart attack Maternal Grandfather 75   Lung cancer Paternal Grandfather 61   No current outpatient medications on file prior to visit.   No current facility-administered medications on file prior to visit.    Review of Systems  Constitutional:  Negative for activity change, appetite change, chills, diaphoresis, fatigue and fever.  HENT:  Negative for congestion and hearing loss.   Eyes:  Negative for visual disturbance.  Respiratory:  Negative for cough, chest tightness, shortness of breath and wheezing.   Cardiovascular:  Negative for chest pain, palpitations and leg swelling.  Gastrointestinal:  Negative for abdominal pain, constipation, diarrhea, nausea and vomiting.  Genitourinary:  Negative for dysuria, frequency and hematuria.   Musculoskeletal:  Negative for arthralgias and neck pain.  Skin:  Negative for rash.  Neurological:  Negative for dizziness, weakness, light-headedness, numbness and headaches.  Hematological:  Negative for adenopathy.  Psychiatric/Behavioral:  Negative for behavioral problems, dysphoric mood and sleep disturbance.    Per HPI unless specifically indicated above      Objective:    BP 118/72   Pulse 75   Ht 5\' 5"  (1.651 m)   Wt 132 lb (59.9 kg)   SpO2 97%   BMI 21.97 kg/m   Wt Readings from Last 3 Encounters:  03/12/23 132 lb (59.9 kg)  05/08/21 142 lb (64.4 kg)  07/16/19 147 lb (66.7 kg)    Physical Exam Vitals and nursing note reviewed.  Constitutional:      General: She is not in acute distress.    Appearance: She is well-developed. She is not diaphoretic.     Comments: Well-appearing, comfortable, cooperative  HENT:     Head: Normocephalic and atraumatic.  Eyes:     General:        Right eye: No discharge.        Left eye: No discharge.     Conjunctiva/sclera: Conjunctivae normal.     Pupils: Pupils are equal, round, and reactive to light.  Neck:     Thyroid: No thyromegaly.     Vascular: No carotid bruit.  Cardiovascular:     Rate and Rhythm: Normal rate and regular rhythm.     Pulses: Normal pulses.     Heart sounds: Normal heart sounds. No murmur heard. Pulmonary:     Effort: Pulmonary effort is normal. No respiratory distress.     Breath sounds: Normal breath sounds. No wheezing or rales.  Abdominal:     General: Bowel sounds are normal. There is no distension.     Palpations: Abdomen is soft. There is no mass.     Tenderness: There is no abdominal tenderness.  Musculoskeletal:        General: No tenderness. Normal range of motion.     Cervical back: Normal range of motion and neck supple.     Right lower leg: No edema.     Left lower leg: No edema.     Comments: Upper / Lower Extremities: - Normal muscle tone, strength bilateral upper extremities  5/5, lower extremities 5/5  Lymphadenopathy:     Cervical: No cervical adenopathy.  Skin:    General: Skin is warm and dry.     Findings: No erythema or rash.  Neurological:     Mental Status: She is alert and oriented to person, place, and time.     Comments: Distal sensation intact to light touch all extremities  Psychiatric:  Mood and Affect: Mood normal.        Behavior: Behavior normal.        Thought Content: Thought content normal.     Comments: Well groomed, good eye contact, normal speech and thoughts    Results for orders placed or performed in visit on 03/05/23  TSH  Result Value Ref Range   TSH 2.37 mIU/L  CBC with Differential/Platelet  Result Value Ref Range   WBC 7.4 3.8 - 10.8 Thousand/uL   RBC 4.19 3.80 - 5.10 Million/uL   Hemoglobin 14.0 11.7 - 15.5 g/dL   HCT 13.0 86.5 - 78.4 %   MCV 101.7 (H) 80.0 - 100.0 fL   MCH 33.4 (H) 27.0 - 33.0 pg   MCHC 32.9 32.0 - 36.0 g/dL   RDW 69.6 29.5 - 28.4 %   Platelets 152 140 - 400 Thousand/uL   MPV 10.3 7.5 - 12.5 fL   Neutro Abs 4,751 1,500 - 7,800 cells/uL   Absolute Lymphocytes 1,924 850 - 3,900 cells/uL   Absolute Monocytes 518 200 - 950 cells/uL   Eosinophils Absolute 141 15 - 500 cells/uL   Basophils Absolute 67 0 - 200 cells/uL   Neutrophils Relative % 64.2 %   Total Lymphocyte 26.0 %   Monocytes Relative 7.0 %   Eosinophils Relative 1.9 %   Basophils Relative 0.9 %  COMPLETE METABOLIC PANEL WITH GFR  Result Value Ref Range   Glucose, Bld 96 65 - 99 mg/dL   BUN 16 7 - 25 mg/dL   Creat 1.32 4.40 - 1.02 mg/dL   eGFR 725 > OR = 60 DG/UYQ/0.34V4   BUN/Creatinine Ratio SEE NOTE: 6 - 22 (calc)   Sodium 137 135 - 146 mmol/L   Potassium 4.1 3.5 - 5.3 mmol/L   Chloride 103 98 - 110 mmol/L   CO2 25 20 - 32 mmol/L   Calcium 9.6 8.6 - 10.2 mg/dL   Total Protein 6.7 6.1 - 8.1 g/dL   Albumin 4.3 3.6 - 5.1 g/dL   Globulin 2.4 1.9 - 3.7 g/dL (calc)   AG Ratio 1.8 1.0 - 2.5 (calc)   Total Bilirubin 0.5 0.2 -  1.2 mg/dL   Alkaline phosphatase (APISO) 53 31 - 125 U/L   AST 13 10 - 30 U/L   ALT 13 6 - 29 U/L  Lipid panel  Result Value Ref Range   Cholesterol 197 <200 mg/dL   HDL 61 > OR = 50 mg/dL   Triglycerides 65 <259 mg/dL   LDL Cholesterol (Calc) 120 (H) mg/dL (calc)   Total CHOL/HDL Ratio 3.2 <5.0 (calc)   Non-HDL Cholesterol (Calc) 136 (H) <130 mg/dL (calc)  Hemoglobin D6L  Result Value Ref Range   Hgb A1c MFr Bld 5.3 <5.7 % of total Hgb   Mean Plasma Glucose 105 mg/dL   eAG (mmol/L) 5.8 mmol/L      Assessment & Plan:   Problem List Items Addressed This Visit     Pure hypercholesterolemia   Tobacco use   Other Visit Diagnoses     Annual physical exam    -  Primary       Updated Health Maintenance information Reviewed recent lab results with patient Encouraged improvement to lifestyle with diet and exercise Goal maintain healthy weight   Hyperlipidemia LDL slightly elevated at 120, but overall cardiovascular risk is low (1.7% 10-year risk) Likely related to Keto diet No family history of heart disease, but there is a family history of high cholesterol. -Continue healthy diet,  consider modifying keto diet to lower LDL. -Future consideration, age 92-50+ consider coronary calcium scan for further cardiovascular risk stratification.  Tobacco Use Encouraging progress on smoking cessation Patient is currently using nicotine replacement patches (14mg ) and has reduced smoking overall -Continue nicotine replacement therapy, consider reducing to 7mg  patches. -Consider utilizing quit line resources for additional support.  General Health Maintenance Recommend mammogram screening every 1-2 years age 57+ last done 2023. She defers today  -Colon cancer screening recommended at age 39, consider Cologuard.  -Continue annual physicals, with flexibility for self-pay visits and selective lab work based on patient's needs and previous normal results.  Follow-up in 1 year for annual  physical.       No orders of the defined types were placed in this encounter.     Follow up plan: Return in about 1 year (around 03/11/2024) for 1 year Annual Physical.  Saralyn Pilar, DO [facility] 03/12/2023, 1:45 PM

## 2023-05-20 IMAGING — MG DIGITAL SCREENING BREAST BILAT IMPLANT W/ TOMO W/ CAD
8 of 12 series · 8 of 28 positions shown · non-contrast
Comparison: Previous exam(s).

CLINICAL DATA: Screening.

EXAM:
DIGITAL SCREENING BILATERAL MAMMOGRAM WITH IMPLANTS, CAD AND
TOMOSYNTHESIS
TECHNIQUE: Bilateral screening digital craniocaudal and mediolateral oblique
mammograms were obtained. Bilateral screening digital breast
tomosynthesis was performed. The images were evaluated with
computer-aided detection. Standard and/or implant displaced views
were performed.

[L CC]
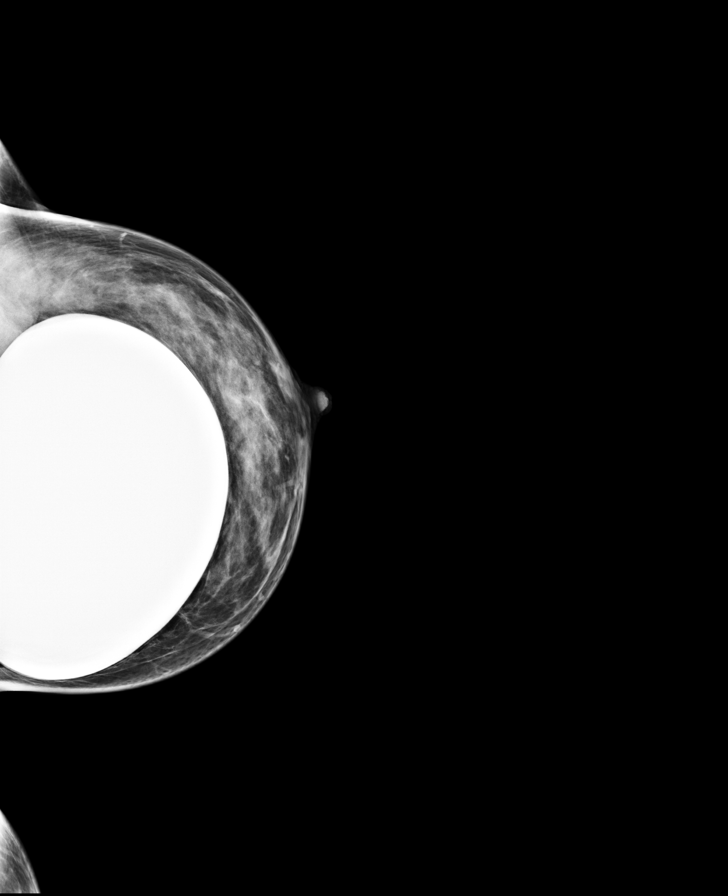

[R MLO]
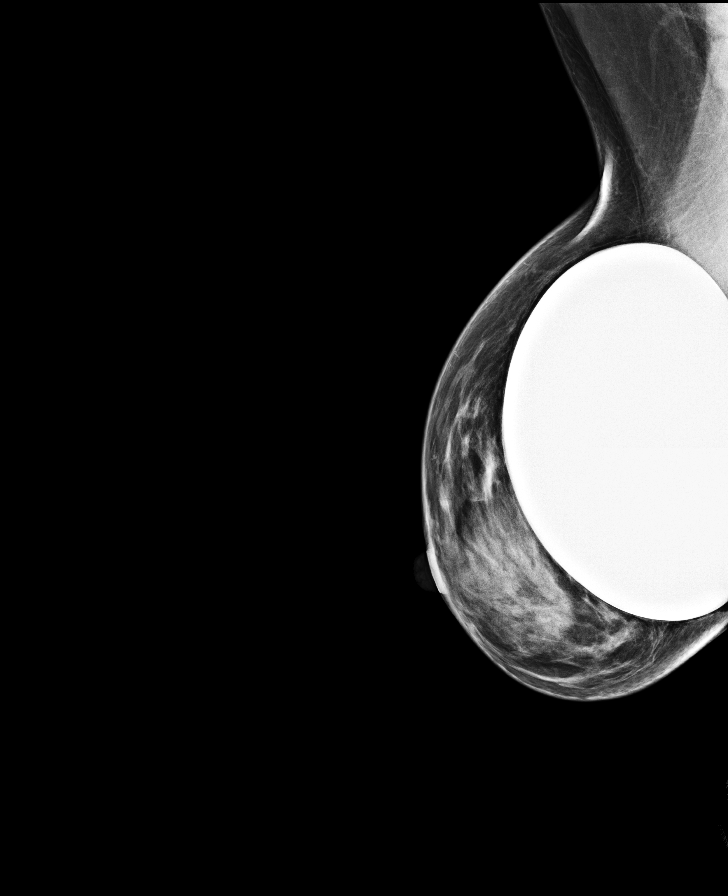

[L MLO]
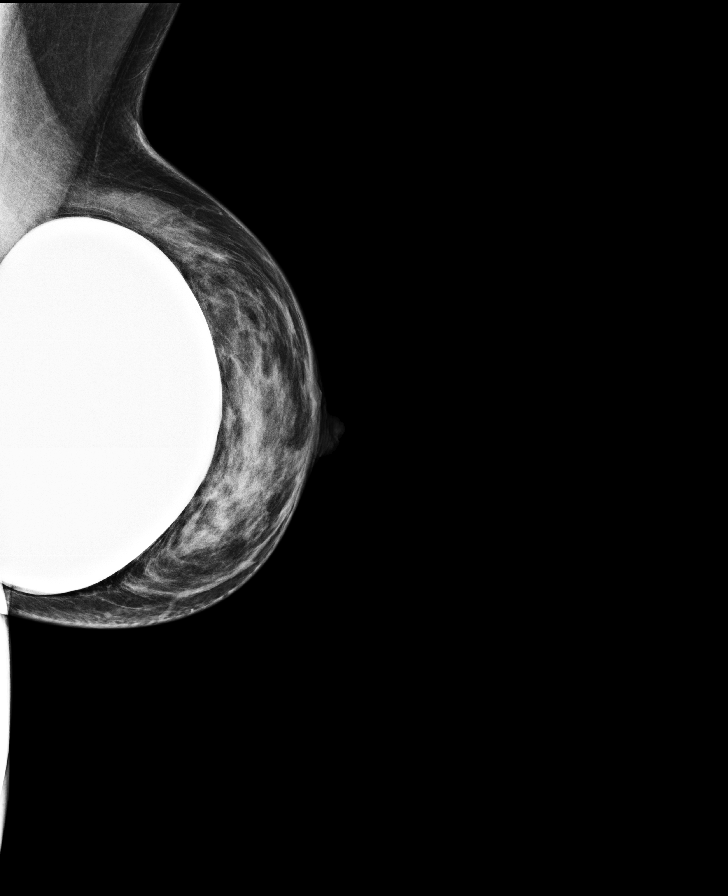

[R CC]
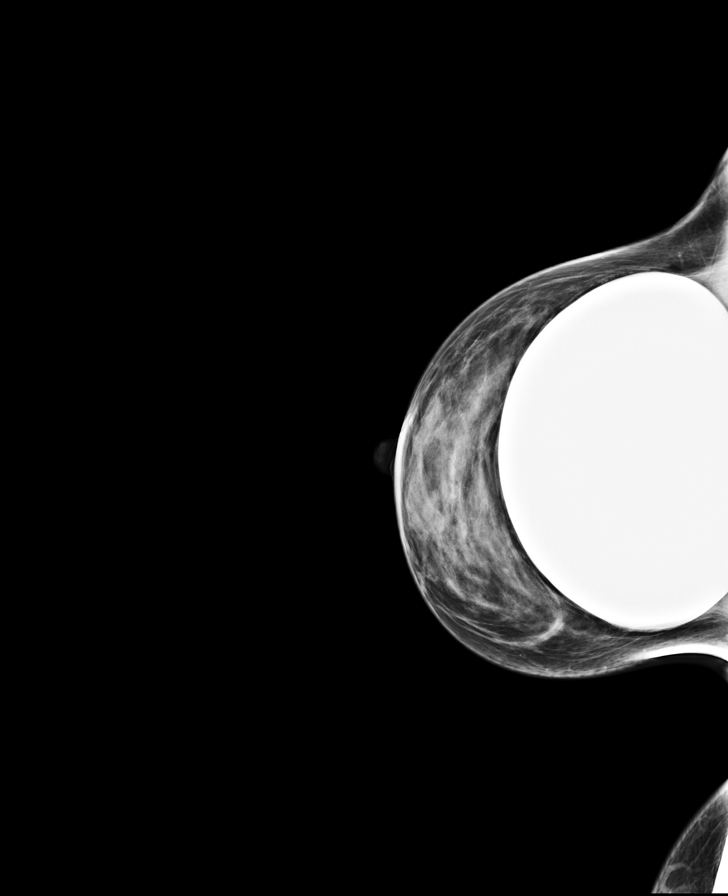

[L MLO synth-2D]
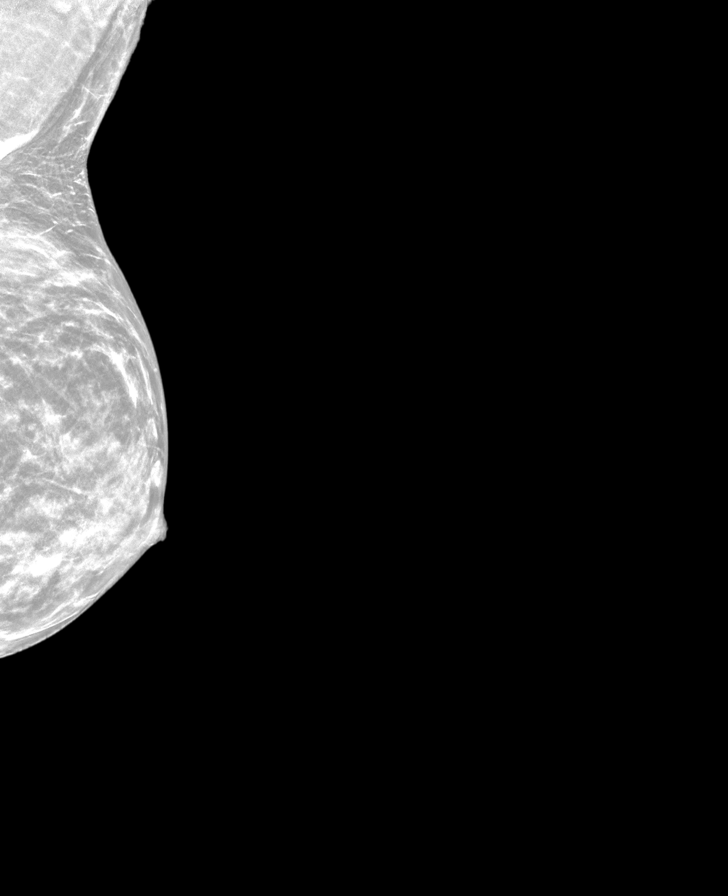

[L CC synth-2D]
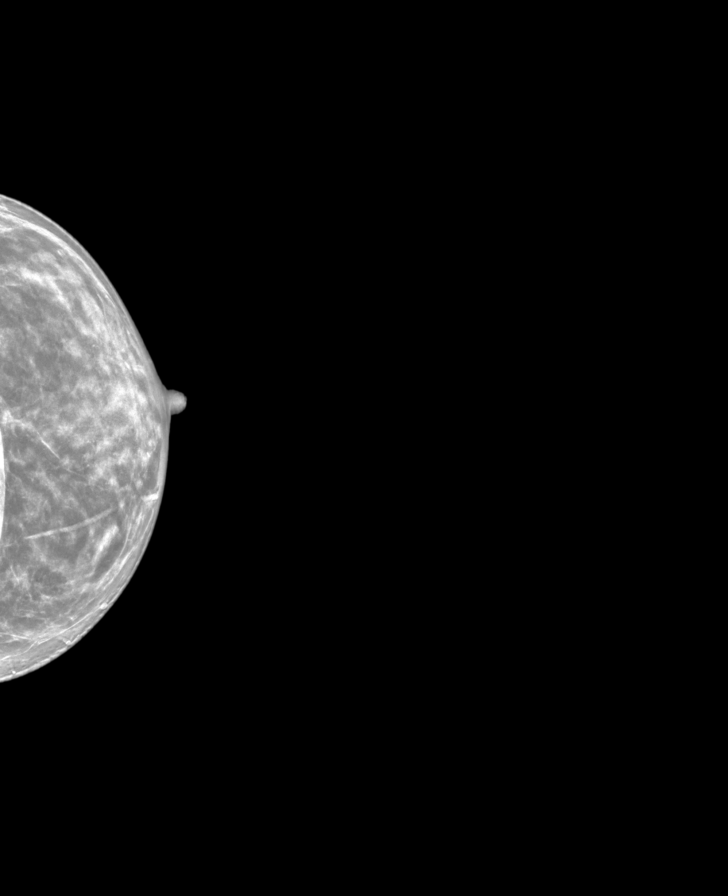

[R MLO synth-2D]
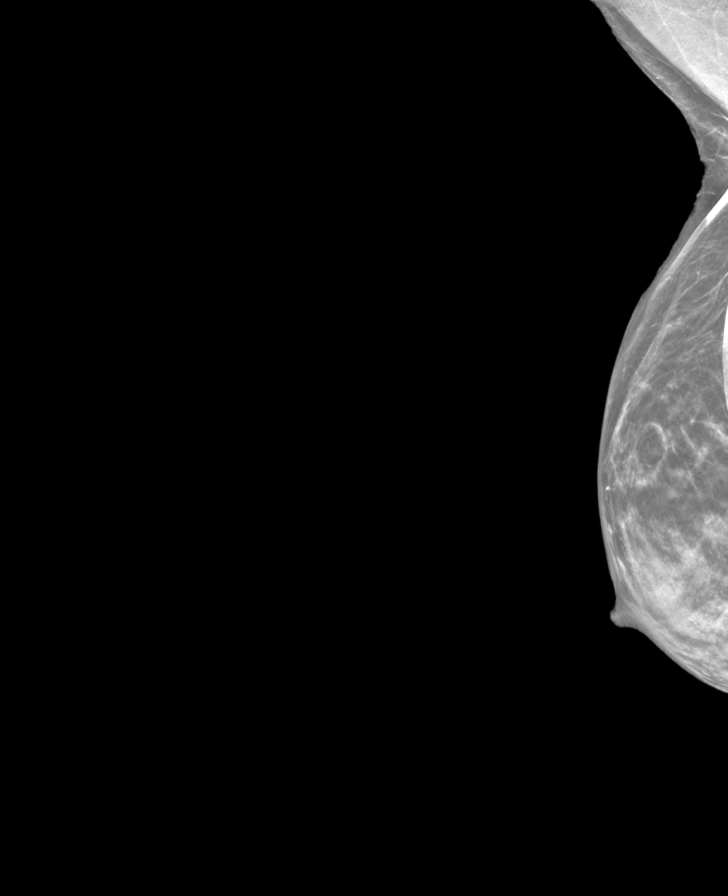

[R CC synth-2D]
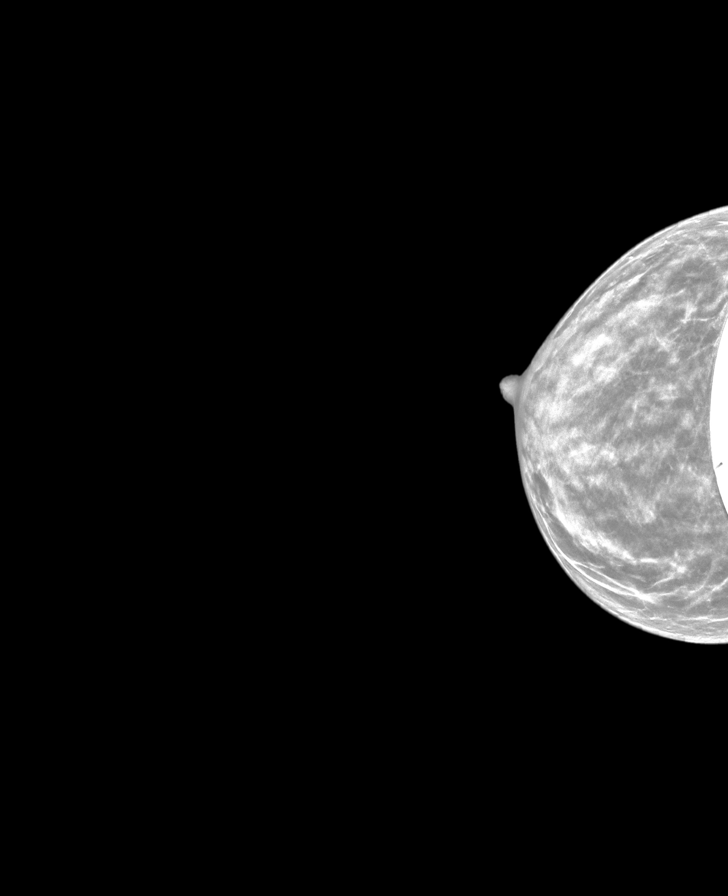

[8 of 28 positions shown; findings below may reference images not displayed]

ACR Breast Density Category d: The breast tissue is extremely dense,
which lowers the sensitivity of mammography.
FINDINGS: The patient has implants. There are no findings suspicious for
malignancy.
IMPRESSION: No mammographic evidence of malignancy. A result letter of this
screening mammogram will be mailed directly to the patient.

RECOMMENDATION:
Screening mammogram in one year. (Code:BI-W-2X7)

BI-RADS CATEGORY  1:  Negative.

## 2023-07-15 ENCOUNTER — Encounter
